# Patient Record
Sex: Female | Born: 1995 | Hispanic: Yes | Smoking: Never smoker
Health system: Southern US, Community
[De-identification: ages and names within clinical notes are randomized; demographics above are authoritative.]

## PROBLEM LIST (undated history)

## (undated) DIAGNOSIS — F99 Mental disorder, not otherwise specified: Secondary | ICD-10-CM

## (undated) HISTORY — DX: Mental disorder, not otherwise specified: F99

---
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
## 2020-11-06 ENCOUNTER — Ambulatory Visit (LOCAL_COMMUNITY_HEALTH_CENTER): Payer: Self-pay | Admitting: Student

## 2020-11-06 ENCOUNTER — Other Ambulatory Visit: Payer: Self-pay

## 2020-11-06 ENCOUNTER — Ambulatory Visit: Payer: Self-pay | Admitting: Family Medicine

## 2020-11-06 ENCOUNTER — Other Ambulatory Visit: Payer: Self-pay | Admitting: Family Medicine

## 2020-11-06 VITALS — BP 122/76 | HR 95 | Ht 60.0 in | Wt 163.8 lb

## 2020-11-06 DIAGNOSIS — Z1331 Encounter for screening for depression: Secondary | ICD-10-CM

## 2020-11-06 DIAGNOSIS — Z113 Encounter for screening for infections with a predominantly sexual mode of transmission: Secondary | ICD-10-CM

## 2020-11-06 DIAGNOSIS — Z23 Encounter for immunization: Secondary | ICD-10-CM

## 2020-11-06 DIAGNOSIS — Z3009 Encounter for other general counseling and advice on contraception: Secondary | ICD-10-CM

## 2020-11-06 LAB — WET PREP FOR TRICH, YEAST, CLUE
Trichomonas Exam: NEGATIVE
Yeast Exam: NEGATIVE

## 2020-11-06 NOTE — Progress Notes (Signed)
TDap given, tolerated well. Secondary apt.

## 2020-11-06 NOTE — Progress Notes (Signed)
Presents for PAP and PE. Desires STD screen and blood work. Results reviewed with provider, no treatment indicated per standing orders. Sharlyne Pacas, RN

## 2020-11-06 NOTE — Progress Notes (Signed)
[facility] [address] [place], [place] [address] Main Number: [phone]  Family Planning Visit- Initial Visit  Subjective:  Barbara Jones is a 24 y.o.  [id number]  being seen today for an initial well woman visit and to discuss family planning options. Patient reports they do not want a pregnancy in the next year.   No chief complaint on file.   Pt does not have a problem list on file.   HPI  Patient reports she is here for physical exam, pap and STI screening.   She uses Nexplanon, has been in 3 years and 7 months. Is currently on medroxyprogesterone acetone (today is day 2) for breakthru bleeding, prescribed by outside provider. Was told to keep Nexplanon in for 5 years by [facility] clinic.    No LMP recorded (lmp unknown). Patient has had an implant. Last sex: yesterday BCM: nexplanon Pt desires EC? n/a  Last pap per pt/review of record: 2018 at [facility] - WNL per pt.  Updated in Epic Care Gaps. Last HIV test per pt/review of record: 2018 Last tetanus vaccine: unsure Covid vaccine: has completed  Last breast exam: never Personal/family hx breast cancer? no  Patient reports 1 partner(s) in last year. Do they desire STI screening (if no, why not)? yes  Does the patient desire a pregnancy in the next year? no  24 y.o., Body mass index is 31.99 kg/m. - Is patient eligible for HA1C diabetes screening based on BMI and age >5?  no  HCV screening;       Has patient been screened once for HCV in the past?  no  No results found for: HCVAB      Does the patient have current drug use, have a partner with drug use, and/or has been incarcerated since last result? no If yes-- Screen for HCV through [facility]   Does the patient meet criteria for HBV testing? no Criteria:  -Household, sexual or needle sharing contact with HBV -History of drug use -HIV positive -Those with known Hep C  PHQ-9 score  22  Working at [employer]. Listens to music for stress release, breathing, puzzles. No exercise.    See flowsheet for other program required questions.   Health Maintenance Due  Topic Date Due  . Hepatitis C Screening  Never done  . COVID-19 Vaccine (1) Never done  . HIV Screening  Never done  . TETANUS/TDAP  Never done  . PAP-Cervical Cytology Screening  11/29/2019  . PAP SMEAR-Modifier  11/29/2019  . INFLUENZA VACCINE  Never done    ROS 10 point review of systems is otherwise negative except as mentioned in HPI and listed below: Blurry vision: d/t needing glasses per pt Wt loss/gain: gain since Nexplanon placement  The following portions of the patient's history were reviewed and updated as appropriate: allergies, current medications, past family history, past medical history, past social history, past surgical history and problem list. Problem list updated.   See flowsheet for other program required questions.  Objective:   Vitals:   11/06/20 1554  BP: 122/76  Pulse: 95  Weight: 163 lb 12.8 oz (74.3 kg)  Height: 5' (1.524 m)    Physical Exam Vitals and nursing note reviewed.  Constitutional:      Appearance: Normal appearance.  HENT:     Head: Normocephalic and atraumatic.     Mouth/Throat:     Mouth: Mucous membranes are moist.     Pharynx: Oropharynx  is clear. No oropharyngeal exudate or posterior oropharyngeal erythema.  Eyes:     Conjunctiva/sclera: Conjunctivae normal.  Neck:     Thyroid: No thyroid mass, thyromegaly or thyroid tenderness.  Cardiovascular:     Rate and Rhythm: Normal rate and regular rhythm.     Pulses: Normal pulses.     Heart sounds: Normal heart sounds.  Pulmonary:     Effort: Pulmonary effort is normal.     Breath sounds: Normal breath sounds.  Chest:     Breasts:        Right: Normal. No swelling, mass, nipple discharge, skin change or tenderness.        Left: Normal. No swelling, mass, nipple discharge,  skin change or tenderness.  Abdominal:     General: Abdomen is flat.     Palpations: There is no mass.     Tenderness: There is no abdominal tenderness. There is no rebound.  Genitourinary:    General: Normal vulva.     Exam position: Lithotomy position.     Pubic Area: No rash or pubic lice.      Labia:            Right: No rash or lesion.            Left: No rash or lesion.      Vagina: Normal. No vaginal erythema, bleeding or lesions. Vaginal discharge: scant, clear, ph>4.5    Cervix: No cervical motion tenderness, discharge, friability, lesion or erythema.     Uterus: Normal.      Adnexa: Right adnexa normal and left adnexa normal.     Rectum: Normal.  Lymphadenopathy:     Head:     Right side of head: No preauricular or posterior auricular adenopathy.     Left side of head: No preauricular or posterior auricular adenopathy.     Cervical: No cervical adenopathy.     Upper Body:     Right upper body: No supraclavicular or axillary adenopathy.     Left upper body: No supraclavicular or axillary adenopathy.     Lower Body: No right inguinal adenopathy. No left inguinal adenopathy.  Skin:    General: Skin is warm and dry.     Findings: No rash. +Faint, well healed linear scars on R forearm Neurological:     Mental Status: She is alert and oriented to person, place, and time.      Assessment and Plan:  Barbara Jones is a 24 y.o. female presenting to the [facility] Department for an initial well woman exam/family planning visit.  Contraception counseling: Reviewed all forms of birth control options in the tiered based approach. available including abstinence; over the counter/barrier methods; hormonal contraceptive medication including pill, patch, ring, injection,contraceptive implant, ECP; hormonal and nonhormonal IUDs; permanent sterilization options including vasectomy and the various tubal sterilization modalities. Risks, benefits, and typical effectiveness  rates were reviewed.  Questions were answered.  Written information was also given to the patient to review.  Patient desires to continue with Nexplanon. She will follow up in  1 year for surveillance.  She was told to call with any further questions, or with any concerns about this method of contraception.  Emphasized use of condoms 100% of the time for STI prevention.  Emergency Contraception: n/a  1. Family planning services -BCM: Nexplanon has been in 3 yrs 7 months. We discussed efficacy likely proven to 4-5 years per UpToDate. She was advised by provider at [facility] to keep until  5 years, is comfortable leaving until then. Declines new Nexplanon or switch to other method today. -Pap: done today -CBE: done today. "Active FYIs" info up to date. Recommended screening mammograms beginning at age 37 -Hepatitis B/C screening: pt qualifies for and accepts hep C screening -Due for tetanus today and agrees to vaccine. RN notified. -A1c screening: n/a - IGP, rfx Aptima HPV ASCU  2. Screening examination for venereal disease -Pt without symptoms. Screenings today as below. Treat wet prep per standing order. -Patient does meet criteria for HepC Screening. Accepts these screenings. -Counseled on warning s/sx and when to seek care. Recommended condom use with all sex and discussed importance of condom use for STI prevention. - WET PREP FOR TRICH, YEAST, CLUE - Chlamydia/Gonorrhea [facility] - HIV/HCV [facility] - Syphilis Serology, [facility]  3. Positive depression screening -Upon flowsheet questioning pt endorses depression w/PHQ-9 score of 22. She would like to speak with a counselor. Will refer to behavioral health today as urgent.  -She endorses thoughts of harming herself with cutting - last cutting episode in November. She denies current thoughts of harm; affect is appropriate and she converses easily today in clinic. I advised pt if further thoughts of harming self are present to  go to ER. 24/7 crisis number card and AM card given to pt.  - Ambulatory referral to Behavioral Health     Return in about 1 year (around 11/06/2021) for yearly wellness exam.  No future appointments.  Ann Held, PA-C

## 2020-11-11 LAB — IGP, RFX APTIMA HPV ASCU: PAP Smear Comment: 0

## 2020-11-16 ENCOUNTER — Encounter: Payer: Self-pay | Admitting: Family Medicine

## 2020-11-18 LAB — HM HIV SCREENING LAB: HM HIV Screening: NEGATIVE

## 2020-11-18 LAB — HM HEPATITIS C SCREENING LAB: HM Hepatitis Screen: NEGATIVE

## 2020-12-09 ENCOUNTER — Ambulatory Visit: Payer: Self-pay | Admitting: Licensed Clinical Social Worker

## 2020-12-24 ENCOUNTER — Ambulatory Visit: Payer: Self-pay | Admitting: Licensed Clinical Social Worker

## 2020-12-24 ENCOUNTER — Encounter: Payer: Self-pay | Admitting: Licensed Clinical Social Worker

## 2020-12-24 DIAGNOSIS — F331 Major depressive disorder, recurrent, moderate: Secondary | ICD-10-CM

## 2020-12-24 DIAGNOSIS — F411 Generalized anxiety disorder: Secondary | ICD-10-CM
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
## 2021-01-13 ENCOUNTER — Ambulatory Visit: Payer: Self-pay | Admitting: Licensed Clinical Social Worker

## 2021-01-13 DIAGNOSIS — F331 Major depressive disorder, recurrent, moderate: Secondary | ICD-10-CM

## 2021-01-13 DIAGNOSIS — F411 Generalized anxiety disorder: Secondary | ICD-10-CM

## 2021-01-13 NOTE — Progress Notes (Signed)
Counselor/Therapist Progress Note  Patient ID: Barbara Jones, MRN: [id number],    Date: 01/13/2021  Time Spent: 50 minutes  Treatment Type: Psychotherapy  Reported Symptoms: depressed mood, anxiety, anxious thoughts, low energy  Mental Status Exam:  Appearance:   Casual and Neat     Behavior:  Appropriate, Sharing and minimal participation  Motor:  Normal  Speech/Language:   Normal Rate  Affect:  Flat  Mood:  depressed and dysthymic  Thought process:  normal  Thought content:    WNL  Sensory/Perceptual disturbances:    WNL  Orientation:  oriented to person, place, time/date and situation  Attention:  Good  Concentration:  Good  Memory:  WNL  Fund of knowledge:   Good  Insight:    Fair  Judgment:   Fair  Impulse Control:  Fair   Risk Assessment: Danger to Self:  No Self-injurious Behavior: No Danger to Others: No Duty to Warn:no Physical Aggression / Violence:No  Access to Firearms a concern: No  Gang Involvement:No   Subjective: Patient was engaged and cooperative throughout the session using time effectively to discuss thoughts, feelings, and treatment plan. Patient voices continued motivation for treatment and understanding of depression and anxiety issues related to family challenges. Patient is likely to benefit from future treatment because she remains motivated to decrease symptoms.    Interventions: Cognitive Behavioral Therapy Checked in with patient and reviewed previous session, including assessment and goal of treatment. Reviewed CBTs. Explored patient's goal of treatment and worked collaboratively to develop CBT treatment plan. Explored patient's challenges with family conflict, assisting patient in identifying personal goals and values related to family. Provided support through active listening, validation of feelings, and highlighted patient's strengths.  Diagnosis:   ICD-10-CM   1. Major depressive disorder, recurrent episode, moderate (HCC)  F33.1    2. Generalized anxiety disorder  F41.1    Plan: follow up on info. Related to banking and other account challenges when you do not have legal [place] status.   Patient's goal of treatment is to not to depend on medications.   Treatment Target: Understand the relationship between thoughts, emotions, and behaviors  - Psychoeducation on CBT model   - Teach the connection between thoughts, emotions, and behaviors  Treatment Target: Increase realistic balanced thinking  - Explore patient's thoughts, beliefs, automatic thoughts, assumptions  - Identify hot thoughts (upsetting ideas, self-talk and mental images) - Process distress and allow for emotional release  - Cognitive reframing  - Questioning and challenging thoughts Treatment Target: Reducing vulnerability to "emotional mind" - Values and goals clarification   - Assertiveness Communication - Self-care - nutrition, sleep, exercise  Treatment Target: Increase coping skills - Mindfulness practices  - Deep breathing  - Grounding techniques as necessary  - STOP technique  Future Appointments  Date Time Provider Department Center  01/27/2021  2:30 PM Kathreen Cosier, LCSW [facility] None    Kathreen Cosier, LCSW

## 2021-01-27 ENCOUNTER — Ambulatory Visit: Payer: Self-pay | Admitting: Licensed Clinical Social Worker

## 2021-02-10 ENCOUNTER — Ambulatory Visit: Payer: Self-pay | Admitting: Licensed Clinical Social Worker

## 2021-02-11 ENCOUNTER — Ambulatory Visit: Payer: Self-pay | Admitting: Licensed Clinical Social Worker

## 2021-02-11 DIAGNOSIS — F411 Generalized anxiety disorder: Secondary | ICD-10-CM

## 2021-02-11 DIAGNOSIS — F331 Major depressive disorder, recurrent, moderate: Secondary | ICD-10-CM

## 2021-02-11 NOTE — Progress Notes (Signed)
Counselor/Therapist Progress Note  Patient ID: Barbara Jones, MRN: [id number],    Date: 02/11/2021  Time Spent: 25 minutes   Treatment Type: Psychotherapy  Reported Symptoms: Decrease in symptoms and mood imporvement continues to take medication as prescribed   Mental Status Exam:  Appearance:   Casual     Behavior:  Appropriate and guarded  Motor:  Normal  Speech/Language:   Slow  Affect:  Appropriate, Congruent and Full Range  Mood:  normal  Thought process:  normal  Thought content:    WNL  Sensory/Perceptual disturbances:    WNL  Orientation:  oriented to person, place, time/date and situation  Attention:  Good  Concentration:  Good  Memory:  WNL  Fund of knowledge:   Good  Insight:    Fair  Judgment:   Good  Impulse Control:  Good   Risk Assessment: Danger to Self:  No Self-injurious Behavior: No Danger to Others: No Duty to Warn:no Physical Aggression / Violence:No  Access to Firearms a concern: No  Gang Involvement:No   Subjective: Patient was engaged and cooperative throughout the session using time effectively to discuss thoughts, feelings and termination of services. Patient voices significant improvement in mood and functioning and reports she is taking medicaiton as prescribed. Patient voices she has met her goal of treatment and would like to seek services as needed.   Interventions: Cognitive Behavioral Therapy Checked in with patient regarding symptoms and psychosocial stressors. Reviewed previous session with patient regarding living within her values. Discussed patient's progress in treatment. Engaged patient in relapse prevention discussion, reviewing behavior activation strategies to prevent depressed mood. Encouraged patient to return as needed. Provided support through active listening, validation of feelings, and highlighted patient's strengths.   Diagnosis:   ICD-10-CM   1. Major depressive disorder, recurrent episode, moderate (HCC)  F33.1    2. Generalized anxiety disorder  F41.1     Plan: Patient will schedule appointment as needed.    Milton Ferguson, LCSW

## 2021-02-18 ENCOUNTER — Ambulatory Visit: Payer: Self-pay | Admitting: Licensed Clinical Social Worker

## 2021-03-18 ENCOUNTER — Ambulatory Visit: Payer: Self-pay | Admitting: Licensed Clinical Social Worker

## 2021-04-01 ENCOUNTER — Ambulatory Visit: Payer: Self-pay | Admitting: Licensed Clinical Social Worker

## 2021-04-01 DIAGNOSIS — F331 Major depressive disorder, recurrent, moderate: Secondary | ICD-10-CM

## 2021-04-01 DIAGNOSIS — F411 Generalized anxiety disorder: Secondary | ICD-10-CM

## 2021-04-01 NOTE — Progress Notes (Signed)
Counselor/Therapist Progress Note  Patient ID: Barbara Jones, MRN: [id number],    Date: 04/01/2021  Time Spent:  50 minutes  Treatment Type: Psychotherapy  Reported Symptoms: depressed mood, anxiety, night mares, intrusive thoughts  Mental Status Exam:  Appearance:   Casual and Neat     Behavior:  Appropriate and Sharing  Motor:  Normal  Speech/Language:   Normal Rate  Affect:  Appropriate and Congruent  Mood:  normal  Thought process:  goal directed  Thought content:    WNL  Sensory/Perceptual disturbances:    WNL  Orientation:  oriented to person, place, time/date, situation and month of year  Attention:  Good  Concentration:  Good  Memory:  WNL  Fund of knowledge:   Good  Insight:    Good  Judgment:   Good  Impulse Control:  Good   Risk Assessment: Danger to Self:  No Self-injurious Behavior: No Danger to Others: No Duty to Warn:no Physical Aggression / Violence:No  Access to Firearms a concern: No  Gang Involvement:No   Subjective: Patient was engaged and cooperative throughout the session using time effectively to discuss thoughts and feelings. Patient voices continued motivation for treatment and understanding of trauma symptoms. Patient is likely to benefit from future treatment because she desires to decrease symptoms related to childhood trauma.   Interventions: Cognitive Behavioral Therapy Checked in with patient regarding her week. Engaged patient in processing current psychosocial stressors, increase in trauma symptoms due to triggers. Discussed childhood trauma and provided psychoeducation on trauma. Taught patient grounding techniques using her 5 senses. Provided support through active listening, validation of feelings, and highlighted patient's strengths.   Diagnosis:   ICD-10-CM   1. Major depressive disorder, recurrent episode, moderate (HCC)  F33.1   2. Generalized anxiety disorder  F41.1    Plan: Patient's goal of treatment isto not to depend  on medications.  Treatment Target: Understand the relationship between thoughts, emotions, and behaviors   Psychoeducation on CBT model    Teach the connection between thoughts, emotions, and behaviors  Treatment Target: Increase realistic balanced thinking   Explore patient's thoughts, beliefs, automatic thoughts, assumptions   Identify hot thoughts(upsetting ideas, self-talk and mental images)  Process distress/trauma and allow for emotional release   Cognitive reframing   Questioning and challenging thoughts Treatment Target: Reducing vulnerability to "emotional mind"  Values and goals clarification    Assertiveness Communication  Self-care - nutrition, sleep, exercise  Treatment Target: Increase coping skills  Mindfulness practices   Deep breathing   Grounding techniques as necessary   STOP technique  Future Appointments  Date Time Provider Department Center  04/29/2021  3:00 PM Kathreen Cosier, LCSW [facility] None   Kathreen Cosier, LCSW

## 2021-04-29 ENCOUNTER — Ambulatory Visit: Payer: Self-pay | Admitting: Licensed Clinical Social Worker

## 2021-04-29 DIAGNOSIS — F411 Generalized anxiety disorder: Secondary | ICD-10-CM

## 2021-04-29 DIAGNOSIS — F331 Major depressive disorder, recurrent, moderate: Secondary | ICD-10-CM

## 2021-04-29 NOTE — Progress Notes (Signed)
Counselor/Therapist Progress Note  Patient ID: Barbara Jones, MRN: [id number],    Date: 04/29/2021  Time Spent: 41 minutes    Treatment Type: Psychotherapy  Reported Symptoms: mood improvement  Mental Status Exam:  Appearance:   Casual and Well Groomed     Behavior:  Appropriate and Sharing  Motor:  Normal  Speech/Language:   Normal Rate  Affect:  Appropriate and Congruent  Mood:  normal  Thought process:  normal  Thought content:    WNL  Sensory/Perceptual disturbances:    WNL  Orientation:  oriented to person, place, time/date, situation, day of week and month of year  Attention:  Good  Concentration:  Good  Memory:  WNL  Fund of knowledge:   Good  Insight:    Good  Judgment:   Good  Impulse Control:  Good   Risk Assessment: Danger to Self:  No Self-injurious Behavior: No Danger to Others: No Duty to Warn:no Physical Aggression / Violence:No  Access to Firearms a concern: No  Gang Involvement:No   Subjective: Patient was engaged and cooperative throughout the session using time effectively to discuss thoughts, feelings, and mindfulness. Patient voices a desire to schedule appointments as needed. She continues medication management and reports benefit. She also reports benefit from grounding 5 senses exercises discussed at last session.    Interventions: Cognitive Behavioral Therapy and Mindfulness Meditation Checked in with patient regarding her week. Reviewed previous session regarding use of grounding techniques. Engaged patient in processing recent conflict with romantic partner which led to sadness. Validated patient's feelings. Highlighted and discussed patient's use of journaling and assertiveness communication to cope with the distress. Engaged patient in mindfulness exercise, processed exercise and encouraged her to utilize. Patient was engaged and cooperative throughout the session using time effectively to discuss   Diagnosis:   ICD-10-CM   1. Major  depressive disorder, recurrent episode, moderate (HCC)  F33.1   2. Generalized anxiety disorder  F41.1    Plan:  Patient's goal of treatment isto not to depend on medications. Treatment Target: Understand the relationship between thoughts, emotions, and behaviors   Psychoeducation on CBT model   Teach the connection between thoughts, emotions, and behaviors  Treatment Target: Increase realistic balanced thinking   Explore patient's thoughts, beliefs, automatic thoughts, assumptions   Identify hot thoughts(upsetting ideas, self-talk and mental images)  Process distress/trauma and allow for emotional release   Cognitive reframing   Questioning and challenging thoughts Treatment Target: Reducing vulnerability to "emotional mind"  Valuesand goalsclarification  Assertiveness Communication  Self-care -nutrition, sleep, exercise  Treatment Target: Increase coping skills  Mindfulness practices   Deep breathing   Grounding techniques as necessary   STOP technique  No future appointments. Patient will reach out via email when she needs another appointment.  Kathreen Cosier, LCSW

## 2021-09-06 ENCOUNTER — Ambulatory Visit: Payer: Self-pay | Admitting: Licensed Clinical Social Worker

## 2021-09-06 DIAGNOSIS — F331 Major depressive disorder, recurrent, moderate: Secondary | ICD-10-CM

## 2021-09-06 DIAGNOSIS — F411 Generalized anxiety disorder: Secondary | ICD-10-CM

## 2021-09-06 NOTE — Progress Notes (Signed)
Counselor/Therapist Progress Note  Patient ID: Barbara Jones, MRN: [id number],    Date: 09/06/2021  Time Spent: 45 minutes    Treatment Type: Psychotherapy  Reported Symptoms:  Anxiety, anxious thoughts, low mood  Mental Status Exam:  Appearance:   Casual and Well Groomed     Behavior:  Appropriate and Sharing  Motor:  Normal  Speech/Language:   Normal Rate  Affect:  Appropriate and Congruent  Mood:  normal  Thought process:  normal  Thought content:    WNL  Sensory/Perceptual disturbances:    WNL  Orientation:  oriented to person, place, time/date, situation, and day of week  Attention:  Good  Concentration:  Good  Memory:  WNL  Fund of knowledge:   Good  Insight:    Good  Judgment:   Good  Impulse Control:  Good   Risk Assessment: Danger to Self:  No Self-injurious Behavior: No Danger to Others: No Duty to Warn:no Physical Aggression / Violence:No  Access to Firearms a concern: No  Gang Involvement:No   Subjective: Patient was receptive to feedback and intervention from LCSW and actively and effectively participated throughout the session. Patient is likely to benefit from future treatment because she remains motivated to decrease anxiety and depression and reports benefit of sessions in addressing these symptoms.    Interventions: Cognitive Behavioral Therapy Checked in with patient regarding current psychosocial stressors, financial stressors and family challenges. Explored patient's perception of challenges to increase understanding and to identify unhelpful thoughts, using challenging thoughts and reframing techniques. Discussed patient's thoughts and beliefs related to family challenges and discussed effective communication skills and boundary setting. LCSW reviewed intentional breathing encouraging patient to engage in breathing exercises often throughout her day. Provided support through active listening, validation of feelings, and highlighted patient's  strengths.    Diagnosis:   ICD-10-CM   1. Major depressive disorder, recurrent episode, moderate (HCC)  F33.1     2. Generalized anxiety disorder  F41.1      Plan: Patient's goal of treatment is to not to depend on medications.  Patient desires to have appointments as needed.  Treatment Target: Understand the relationship between thoughts, emotions, and behaviors  Psychoeducation on CBT model   Teach the connection between thoughts, emotions, and behaviors  Treatment Target: Increase realistic balanced thinking  Explore patient's thoughts, beliefs, automatic thoughts, assumptions  Identify hot thoughts (upsetting ideas, self-talk and mental images) Process distress/trauma and allow for emotional release  Cognitive reframing  Questioning and challenging thoughts Treatment Target: Reducing vulnerability to "emotional mind" Values and goals clarification   Assertiveness Communication Self-care - nutrition, sleep, exercise  Treatment Target: Increase coping skills Mindfulness practices  Deep breathing/intentional breathing  Grounding techniques as necessary  STOP technique  Future Appointments  Date Time Provider Department Center  09/27/2021  2:00 PM Kathreen Cosier, LCSW [facility] None    Kathreen Cosier, LCSW

## 2021-09-27 ENCOUNTER — Ambulatory Visit: Payer: Self-pay | Admitting: Licensed Clinical Social Worker

## 2021-10-20 ENCOUNTER — Ambulatory Visit
Admission: EM | Admit: 2021-10-20 | Discharge: 2021-10-20 | Disposition: A | Discharge status: Home / Self Care | Payer: Self-pay

## 2021-10-20 DIAGNOSIS — T7840XA Allergy, unspecified, initial encounter: Secondary | ICD-10-CM

## 2021-10-20 DIAGNOSIS — J069 Acute upper respiratory infection, unspecified: Secondary | ICD-10-CM

## 2021-10-20 MED ORDER — PROMETHAZINE-DM 6.25-15 MG/5ML PO SYRP: 5.0000 mL | ORAL_SOLUTION | Freq: Four times a day (QID) | ORAL | 0 refills | Status: AC | PRN

## 2021-10-20 MED ORDER — BENZONATATE 100 MG PO CAPS: 200.0000 mg | ORAL_CAPSULE | Freq: Three times a day (TID) | ORAL | 0 refills | Status: AC

## 2021-10-20 MED ORDER — IPRATROPIUM BROMIDE 0.06 % NA SOLN: 2.0000 | Freq: Four times a day (QID) | NASAL | 12 refills | Status: AC

## 2021-10-20 MED ORDER — PREDNISONE 10 MG (21) PO TBPK: ORAL_TABLET | ORAL | 0 refills | Status: AC

## 2021-10-20 NOTE — ED Provider Notes (Signed)
[facility]    CSN: [id number] Arrival date & time: 10/20/21  1028      History   Chief Complaint Chief Complaint  Patient presents with   Urticaria    HPI Barbara Jones is a 25 y.o. female.   HPI  89 old female here for evaluation of hives.  Patient reports that she has had an upper respiratory infection for the past 3 days.  She saw her PCP yesterday for evaluation of her cough, runny nose, nasal congestion and was told that she had a virus and to take over-the-counter NyQuil and DayQuil.  She reports that she took the DayQuil, some ibuprofen, and ate some food that she has eaten before several hours prior to the onset of hives.  She states that the hives have been around 9 to 9:30 PM.  She also started running a fever that time the T-max of 100.8.  She took Benadryl and oatmeal bath which did help alleviate the itching.  The hives are still present.  She denies experiencing any swelling of her lips or tongue, difficulty breathing, or difficulty swallowing.  Patient has taken DayQuil and NyQuil in the past, as well as ibuprofen, without issue.  Past Medical History:  Diagnosis Date   Mental disorder    depression    There are no problems to display for this patient.   History reviewed. No pertinent surgical history.  OB History     Gravida  0   Para  0   Term  0   Preterm  0   AB  0   Living  0      SAB  0   IAB  0   Ectopic  0   Multiple  0   Live Births  0            Home Medications    Prior to Admission medications   Medication Sig Start Date End Date Taking? Authorizing Provider  benzonatate (TESSALON) 100 MG capsule Take 2 capsules (200 mg total) by mouth every 8 (eight) hours. 10/20/21  Yes Becky Augusta, NP  Etonogestrel [facility]) Inject into the skin. Placed May, 2018 per pt   Yes [provider]  FLUoxetine (PROZAC) 40 MG capsule Take 40 mg by mouth daily. 09/03/21  Yes [provider]   ipratropium (ATROVENT) 0.06 % nasal spray Place 2 sprays into both nostrils 4 (four) times daily. 10/20/21  Yes Becky Augusta, NP  predniSONE (STERAPRED UNI-PAK 21 TAB) 10 MG (21) TBPK tablet Take 6 tablets on day 1, 5 tablets day 2, 4 tablets day 3, 3 tablets day 4, 2 tablets day 5, 1 tablet day 6 10/20/21  Yes Becky Augusta, NP  promethazine-dextromethorphan (PROMETHAZINE-DM) 6.25-15 MG/5ML syrup Take 5 mLs by mouth 4 (four) times daily as needed. 10/20/21  Yes Becky Augusta, NP  MEDROXYPROGESTERONE ACETATE PO Take by mouth.    [provider]  pantoprazole (PROTONIX) 40 MG tablet Take 40 mg by mouth daily.    [provider]    Family History Family History  Problem Relation Age of Onset   Diabetes Father    Migraines Mother    Breast cancer Neg Hx     Social History Social History   Tobacco Use   Smoking status: Never   Smokeless tobacco: Never  Vaping Use   Vaping Use: Some days   Substances: CBD   Devices: CBD pen  Substance Use Topics   Alcohol use: Yes  Comment: "socially"    Drug use: Never     Allergies   Methylmethacrylate monomer [methylmethacrylate]   Review of Systems Review of Systems  Constitutional:  Positive for fever. Negative for activity change and appetite change.  HENT:  Positive for congestion and rhinorrhea. Negative for ear pain and sore throat.   Respiratory:  Positive for cough and shortness of breath. Negative for wheezing.   Skin:  Positive for color change and rash.  Hematological: Negative.   Psychiatric/Behavioral: Negative.      Physical Exam Triage Vital Signs ED Triage Vitals  Enc Vitals Group     BP 10/20/21 1129 129/86     Pulse Rate 10/20/21 1129 (!) 110     Resp 10/20/21 1129 18     Temp 10/20/21 1129 98.9 F (37.2 C)     Temp Source 10/20/21 1129 Oral     SpO2 10/20/21 1129 99 %     Weight 10/20/21 1127 167 lb (75.8 kg)     Height 10/20/21 1127 5' (1.524 m)     Head Circumference --      Peak Flow  --      Pain Score 10/20/21 1126 0     Pain Loc --      Pain Edu? --      Excl. in GC? --    No data found.  Updated Vital Signs BP 129/86 (BP Location: Right Arm)   Pulse (!) 110   Temp 98.9 F (37.2 C) (Oral)   Resp 18   Ht 5' (1.524 m)   Wt 167 lb (75.8 kg)   LMP 10/13/2021   SpO2 99%   BMI 32.61 kg/m   Visual Acuity Right Eye Distance:   Left Eye Distance:   Bilateral Distance:    Right Eye Near:   Left Eye Near:    Bilateral Near:     Physical Exam Vitals and nursing note reviewed.  Constitutional:      General: She is not in acute distress.    Appearance: Normal appearance. She is normal weight. She is not ill-appearing.  HENT:     Head: Normocephalic and atraumatic.     Right Ear: Tympanic membrane, ear canal and external ear normal. There is no impacted cerumen.     Left Ear: Tympanic membrane, ear canal and external ear normal. There is no impacted cerumen.     Nose: Congestion and rhinorrhea present.     Mouth/Throat:     Mouth: Mucous membranes are moist.     Pharynx: Oropharynx is clear. Posterior oropharyngeal erythema present.  Cardiovascular:     Rate and Rhythm: Normal rate and regular rhythm.     Pulses: Normal pulses.     Heart sounds: Normal heart sounds. No murmur heard.   No gallop.  Pulmonary:     Effort: Pulmonary effort is normal.     Breath sounds: Normal breath sounds. No stridor. No wheezing, rhonchi or rales.  Musculoskeletal:     Cervical back: Normal range of motion and neck supple.  Lymphadenopathy:     Cervical: No cervical adenopathy.  Skin:    General: Skin is warm and dry.     Capillary Refill: Capillary refill takes less than 2 seconds.     Findings: Rash present.  Neurological:     General: No focal deficit present.     Mental Status: She is alert and oriented to person, place, and time.  Psychiatric:        Mood and Affect:  Mood normal.        Behavior: Behavior normal.        Thought Content: Thought content  normal.        Judgment: Judgment normal.     UC Treatments / Results  Labs (all labs ordered are listed, but only abnormal results are displayed) Labs Reviewed - No data to display  EKG   Radiology No results found.  Procedures Procedures (including critical care time)  Medications Ordered in UC Medications - No data to display  Initial Impression / Assessment and Plan / UC Course  I have reviewed the triage vital signs and the nursing notes.  Pertinent labs & imaging results that were available during my care of the patient were reviewed by me and considered in my medical decision making (see chart for details).  Patient very pleasant, nontoxic-appearing 20 old female here for evaluation of upper respiratory symptoms and urticaria that began last night.  Her upper respiratory symptoms have been present for last 3 days.  She was evaluated by her PCP yesterday, thought it was a virus, and told to take over-the-counter cold medicine.  She has taken DayQuil and NyQuil, which she has taken before, and ibuprofen.  She is taken all his medications in the past without incident.  She states she also ate some food that she is eaten before without issue.  The urticaria started several hours removed from any medication administration or food consumption.  There were not associated with swelling of the lips or tongue, tightness in throat, or shortness of breath.  Patient states she does have some sort of breath when she coughs but that predates the hives.  Patient still has hives present she has a hive on her posterior right shoulder, anterior left clavicle, and then scattered maculopapular lesions on both forearms and the flexor surfaces.  Patient has no stridor when auscultating over the trachea.  Oropharyngeal exam reveals Mallampati score of 2.  No edema, erythema, or injection noted.  No cervical lymphadenopathy appreciated exam.  Cardiopulmonary exam reveals clear lung sounds all fields.  It is  unclear if the urticaria are secondary to a viral process or to a separate allergic reaction.  We will treat patient for allergic reaction with prednisone, Allegra, Zyrtec, or Claritin during the day and Benadryl at night.  I have also advised her to take Pepcid 20 mg twice daily.  I will have her cease all the over-the-counter cold medication and will treat her cough with a combination of Tessalon Perles and Promethazine DM cough syrup.  We will give Atrovent nasal spray to help with nasal congestion.  I have advised the patient that if she has return of hives that is associated with tightness in her throat, difficulty swallowing, difficulty breathing she is to go to the ER for evaluation.   Final Clinical Impressions(s) / UC Diagnoses   Final diagnoses:  Allergic reaction, initial encounter  Viral URI with cough     Discharge Instructions      Take over-the-counter Allegra 180 mg daily or Zyrtec or Claritin 10 mg daily to help with your itching.  You can take over-the-counter Benadryl, 50 mg at bedtime, as needed for itching and sleep.  Take the prednisone pack according to the package instructions.  You will taken on tapering dose over a period of 6 days.  Take it with food and always take it first in the morning with breakfast.  Take over-the-counter Pepcid 20 mg twice daily to help with itching  as well.  Stop the Dayquil, Nyquil and Ibuprofen.  You can use Tylenol as needed for fever or pain.  Use the Atrovent nasal spray, 2 squirts in each nostril every 6 hours, as needed for runny nose and postnasal drip.  Use the Tessalon Perles every 8 hours during the day.  Take them with a small sip of water.  They may give you some numbness to the base of your tongue or a metallic taste in your mouth, this is normal.  Use the Promethazine DM cough syrup at bedtime for cough and congestion.  It will make you drowsy so do not take it during the day.  Return for reevaluation or see your  primary care provider for any new or worsening symptoms.   If you develop any swelling of your lips or tongue, tightness in your throat, or difficulty breathing you need to go to the ER for evaluation.      ED Prescriptions     Medication Sig Dispense Auth. Provider   predniSONE (STERAPRED UNI-PAK 21 TAB) 10 MG (21) TBPK tablet Take 6 tablets on day 1, 5 tablets day 2, 4 tablets day 3, 3 tablets day 4, 2 tablets day 5, 1 tablet day 6 21 tablet Becky Augusta, NP   ipratropium (ATROVENT) 0.06 % nasal spray Place 2 sprays into both nostrils 4 (four) times daily. 15 mL Becky Augusta, NP   benzonatate (TESSALON) 100 MG capsule Take 2 capsules (200 mg total) by mouth every 8 (eight) hours. 21 capsule Becky Augusta, NP   promethazine-dextromethorphan (PROMETHAZINE-DM) 6.25-15 MG/5ML syrup Take 5 mLs by mouth 4 (four) times daily as needed. 118 mL Becky Augusta, NP      PDMP not reviewed this encounter.   Becky Augusta, NP 10/20/21 1234

## 2021-10-20 NOTE — Discharge Instructions (Signed)
Take over-the-counter Allegra 180 mg daily or Zyrtec or Claritin 10 mg daily to help with your itching.  You can take over-the-counter Benadryl, 50 mg at bedtime, as needed for itching and sleep.  Take the prednisone pack according to the package instructions.  You will taken on tapering dose over a period of 6 days.  Take it with food and always take it first in the morning with breakfast.  Take over-the-counter Pepcid 20 mg twice daily to help with itching as well.  Stop the Dayquil, Nyquil and Ibuprofen.  You can use Tylenol as needed for fever or pain.  Use the Atrovent nasal spray, 2 squirts in each nostril every 6 hours, as needed for runny nose and postnasal drip.  Use the Tessalon Perles every 8 hours during the day.  Take them with a small sip of water.  They may give you some numbness to the base of your tongue or a metallic taste in your mouth, this is normal.  Use the Promethazine DM cough syrup at bedtime for cough and congestion.  It will make you drowsy so do not take it during the day.  Return for reevaluation or see your primary care provider for any new or worsening symptoms.   If you develop any swelling of your lips or tongue, tightness in your throat, or difficulty breathing you need to go to the ER for evaluation.

## 2021-10-20 NOTE — ED Triage Notes (Signed)
Pt here with C/O cough since Monday night, went to PCP yesterday and was told that it was a Virus to take Nyquil and Dayquil. Pt states last night around 9pm she broke out in hives and started running fever 100.8. Did take benadryl and oat meal bath last night with little relief.

## 2021-12-07 ENCOUNTER — Ambulatory Visit: Payer: 59 | Admitting: Licensed Clinical Social Worker

## 2021-12-07 DIAGNOSIS — F411 Generalized anxiety disorder: Secondary | ICD-10-CM

## 2021-12-07 DIAGNOSIS — F331 Major depressive disorder, recurrent, moderate: Secondary | ICD-10-CM

## 2021-12-07 NOTE — Progress Notes (Signed)
Counselor/Therapist Progress Note  Patient ID: Barbara Jones, MRN: [id number],    Date: 12/07/2021  Time Spent: 34 minutes   Treatment Type: Psychotherapy  Reported Symptoms:  overall stable mood   Mental Status Exam:  Appearance:   Casual     Behavior:  Appropriate and Sharing  Motor:  Normal  Speech/Language:   Clear and Coherent and Normal Rate  Affect:  Appropriate and Congruent  Mood:  normal  Thought process:  normal  Thought content:    WNL  Sensory/Perceptual disturbances:    WNL  Orientation:  oriented to person, place, time/date, and situation  Attention:  Fair  Concentration:  Fair  Memory:  WNL  Fund of knowledge:   Good  Insight:    Good  Judgment:   Good  Impulse Control:  Good   Risk Assessment: Danger to Self:  No Self-injurious Behavior: No Danger to Others: No Duty to Warn:no Physical Aggression / Violence:No  Access to Firearms a concern: No  Gang Involvement:No   Subjective: Patient was receptive to feedback and intervention from LCSW. Patient is likely to benefit from future treatment because they remain motivated to manage symptoms and improve functioning. Patient reports continued benefit from medication management - Prozac.    Interventions: Mindfulness Meditation and Client Centered, psychoeducation Established psychological safety.Checked in with patient regarding mood and current psychosocial stressors. LCSW assisted patient in processing their thoughts and emotions about what they experienced with recent challenges with family of origin and  with experiences of disassociation- depersonalization.  LCSW taught patient about disassociation- depersonalization/childhood trauma and reviewed mindfulness and or grounding techniques to come back to the moment by using 5 senses. Checked in on patient's use of medication management. Provided support through active listening, validation of feelings, and highlighted patient's strengths.   Diagnosis:    ICD-10-CM   1. Major depressive disorder, recurrent episode, moderate (HCC)  F33.1     2. Generalized anxiety disorder  F41.1       Plan: Patient's goal of treatment is to not to depend on medications.  Patient desires to have appointments as needed.  Treatment Target: Understand the relationship between thoughts, emotions, and behaviors  Psychoeducation on CBT model   Teach the connection between thoughts, emotions, and behaviors  Treatment Target: Increase realistic balanced thinking  Explore patients thoughts, beliefs, automatic thoughts, assumptions  Identify hot thoughts (upsetting ideas, self-talk and mental images) Process distress/trauma and allow for emotional release  Cognitive reframing  Questioning and challenging thoughts Treatment Target: Reducing vulnerability to emotional mind Values and goals clarification   Assertiveness Communication Self-care - nutrition, sleep, exercise  Treatment Target: Increase coping skills Mindfulness practices  Deep breathing/intentional breathing  Grounding techniques as necessary  STOP technique  No future appointments.   Milton Ferguson, LCSW

## 2022-01-04 ENCOUNTER — Ambulatory Visit: Payer: 59 | Admitting: Licensed Clinical Social Worker

## 2022-01-04 DIAGNOSIS — F331 Major depressive disorder, recurrent, moderate: Secondary | ICD-10-CM

## 2022-01-04 DIAGNOSIS — F411 Generalized anxiety disorder: Secondary | ICD-10-CM

## 2022-01-04 NOTE — Progress Notes (Signed)
Counselor/Therapist Progress Note  Patient ID: Jenisha Faison, MRN: [id number],    Date: 01/04/2022  Time Spent: 35 minutes - patient arrived late   Treatment Type: Psychotherapy  Reported Symptoms:  Overall mood stability; mild anxiety, anxious thoughts  Mental Status Exam:  Appearance:   Casual and Neat     Behavior:  Appropriate and Sharing  Motor:  Normal  Speech/Language:   Clear and Coherent and Normal Rate  Affect:  Appropriate, Congruent, and Full Range  Mood:  euthymic  Thought process:  normal  Thought content:    WNL  Sensory/Perceptual disturbances:    WNL  Orientation:  oriented to person, place, time/date, and situation  Attention:  Good  Concentration:  Good  Memory:  WNL  Fund of knowledge:   Good  Insight:    Good  Judgment:   Good  Impulse Control:  Good   Risk Assessment: Danger to Self:  No Self-injurious Behavior: No Danger to Others: No Duty to Warn:no Physical Aggression / Violence:No  Access to Firearms a concern: No  Gang Involvement:No   Subjective: Patient was engaged and cooperative throughout the session using time effectively to discuss thoughts and feelings.  Patient was receptive to feedback and intervention from LCSW. Patient is likely to benefit from future treatment because they remain motivated to manage depression and anxiety and report benefit of occasional sessions.        Interventions: Cognitive Behavioral Therapy and Psycho-education/Bibliotherapy Established psychological safety. Checked in with patient. Engaged patient in setting session agenda. Provided psychoeducation on medication management and management of depression and anxiety during pregnancy and post partum, including having good support, exercise, a healthy diet, good sleep hygiene and encouraged patient to continue medication as prescriber recommended. LCSW provided sleep hygiene tips, including establishing a bedtime routine, using bed for sex and sleep, use of  mindfulness before bed, putting away electronics at least 30 minutes before bed. Provided support through active listening, validation of feelings, and highlighted patient's strengths.    Diagnosis:   ICD-10-CM   1. Major depressive disorder, recurrent episode, moderate (HCC)  F33.1     2. Generalized anxiety disorder  F41.1       Plan: Patient's goal of treatment is to not to depend on medications.  Patient desires to have appointments as needed.  Treatment Target: Understand the relationship between thoughts, emotions, and behaviors  Psychoeducation on CBT model   Teach the connection between thoughts, emotions, and behaviors  Treatment Target: Increase realistic balanced thinking  Explore patients thoughts, beliefs, automatic thoughts, assumptions  Identify hot thoughts (upsetting ideas, self-talk and mental images) Process distress/trauma and allow for emotional release  Cognitive reframing  Questioning and challenging thoughts Treatment Target: Reducing vulnerability to emotional mind Values and goals clarification   Assertiveness Communication Self-care - nutrition, sleep, exercise  Treatment Target: Increase coping skills Mindfulness practices  Deep breathing/intentional breathing  Grounding techniques as necessary  STOP technique  No future appointments. Patient to schedule appointments as needed.   Kathreen Cosier, LCSW

## 2022-04-22 ENCOUNTER — Ambulatory Visit (LOCAL_COMMUNITY_HEALTH_CENTER): Payer: Managed Care, Other (non HMO) | Admitting: Nurse Practitioner

## 2022-04-22 ENCOUNTER — Ambulatory Visit: Payer: Self-pay

## 2022-04-22 ENCOUNTER — Encounter: Payer: Self-pay | Admitting: Nurse Practitioner

## 2022-04-22 VITALS — BP 124/77 | HR 81 | Temp 97.9°F

## 2022-04-22 DIAGNOSIS — Z3049 Encounter for surveillance of other contraceptives: Secondary | ICD-10-CM

## 2022-04-22 DIAGNOSIS — Z3009 Encounter for other general counseling and advice on contraception: Secondary | ICD-10-CM

## 2022-04-22 DIAGNOSIS — Z01419 Encounter for gynecological examination (general) (routine) without abnormal findings: Secondary | ICD-10-CM | POA: Diagnosis not present

## 2022-04-22 DIAGNOSIS — Z113 Encounter for screening for infections with a predominantly sexual mode of transmission: Secondary | ICD-10-CM

## 2022-04-22 LAB — WET PREP FOR TRICH, YEAST, CLUE
Trichomonas Exam: NEGATIVE
Yeast Exam: NEGATIVE

## 2022-04-22 LAB — HM HIV SCREENING LAB: HM HIV Screening: NEGATIVE

## 2022-04-23 MED ORDER — NORETHINDRONE 0.35 MG PO TABS: 1.0000 | ORAL_TABLET | Freq: Every day | ORAL | 12 refills | Status: AC

## 2022-04-23 NOTE — Progress Notes (Signed)
[facility] DEPARTMENT [facility] [address] Main Number: [phone]    Family Planning Visit- Initial Visit  Subjective:  Barbara Jones is a 26 y.o.  G0P0000   being seen today for an initial annual visit and to discuss reproductive life planning.  The patient is currently using Hormonal Implant for pregnancy prevention. Patient reports does want a pregnancy in the next year.     report they are looking for a method that provides Method they can control starting/stopping  Patient has the following medical conditions does not have a problem list on file.  Chief Complaint  Patient presents with   Contraception    Patient reports to clinic today for a physical and desires to have her Nexplanon removed.  Patient states she had her Nexplanon inserted on 03/28/2017.  Patient desires STD screening today.     There is no height or weight on file to calculate BMI. - Patient is eligible for diabetes screening based on BMI and age >28?  not applicable HA1C ordered? not applicable  Patient reports 1  partner/s in last year. Desires STI screening?  Yes  Has patient been screened once for HCV in the past?  No  No results found for: HCVAB  Does the patient have current drug use (including MJ), have a partner with drug use, and/or has been incarcerated since last result? No  If yes-- Screen for HCV through [facility]   Does the patient meet criteria for HBV testing? No  Criteria:  -Household, sexual or needle sharing contact with HBV -History of drug use -HIV positive -Those with known Hep C   Health Maintenance Due  Topic Date Due   COVID-19 Vaccine (1) Never done   PAP-Cervical Cytology Screening  11/29/2019    Review of Systems  Constitutional:  Negative for chills, fever, malaise/fatigue and weight loss.       Heat and cold intolerance Weight gain    HENT:  Negative for congestion, hearing loss and sore throat.   Eyes:   Positive for blurred vision. Negative for double vision and photophobia.  Respiratory:  Negative for shortness of breath.   Cardiovascular:  Negative for chest pain.  Gastrointestinal:  Positive for nausea. Negative for abdominal pain, blood in stool, constipation, diarrhea, heartburn and vomiting.  Genitourinary:  Negative for dysuria and frequency.  Musculoskeletal:  Negative for back pain, joint pain and neck pain.  Skin:  Negative for itching and rash.  Neurological:  Positive for dizziness. Negative for weakness and headaches.  Endo/Heme/Allergies:  Bruises/bleeds easily.  Psychiatric/Behavioral:  Negative for depression, substance abuse and suicidal ideas.    The following portions of the patient's history were reviewed and updated as appropriate: allergies, current medications, past family history, past medical history, past social history, past surgical history and problem list. Problem list updated.   See flowsheet for other program required questions.  Objective:   Vitals:   04/22/22 1421  BP: 124/77  Pulse: 81  Temp: 97.9 F (36.6 C)    Physical Exam Constitutional:      Appearance: Normal appearance.  HENT:     Head: Normocephalic.     Right Ear: External ear normal.     Left Ear: External ear normal.     Nose: Nose normal.     Mouth/Throat:     Lips: Pink.     Mouth: Mucous membranes are moist.     Comments: No visible signs of dental caries  Eyes:     Pupils: Pupils are equal, round, and reactive to light.  Cardiovascular:     Rate and Rhythm: Normal rate and regular rhythm.  Pulmonary:     Effort: Pulmonary effort is normal.     Breath sounds: Normal breath sounds.  Chest:     Comments: Breasts:        Right: Normal. No swelling, mass, nipple discharge, skin change or tenderness.        Left: Normal. No swelling, mass, nipple discharge, skin change or tenderness.   Abdominal:     General: Abdomen is flat. Bowel sounds are normal.     Palpations:  Abdomen is soft.  Genitourinary:    Comments: External genitalia/pubic area without nits, lice, edema, erythema, lesions and inguinal adenopathy. Vagina with normal mucosa and discharge. Cervix without visible lesions. Uterus firm, mobile, nt, no masses, no CMT, no adnexal tenderness or fullness. pH 4.5. Musculoskeletal:     Cervical back: Full passive range of motion without pain, normal range of motion and neck supple.  Skin:    General: Skin is warm and dry.  Neurological:     Mental Status: She is alert and oriented to person, place, and time.  Psychiatric:        Attention and Perception: Attention normal.        Mood and Affect: Mood normal.        Speech: Speech normal.        Behavior: Behavior normal. Behavior is cooperative.      Assessment and Plan:  Barbara Jones is a 26 y.o. female presenting to the [facility] Department for an initial annual wellness/contraceptive visit  Contraception counseling: Reviewed options based on patient desire and reproductive life plan. Patient is interested in Oral Contraceptive. This was provided to the patient today.   Risks, benefits, and typical effectiveness rates were reviewed.  Questions were answered.  Written information was also given to the patient to review.    The patient will follow up in  1 years for surveillance.  The patient was told to call with any further questions, or with any concerns about this method of contraception.  Emphasized use of condoms 100% of the time for STI prevention.  Need for ECP was assessed. Patient offered ECP, and patient declined.    1. Family planning services -26 year old female in clinic today for a physical and to have her Nexplanon removed. -ROS reviewed.  Patient complains of heat and cold intolerance, dizziness, blurry vision, bruising, weight gain, headache, and nausea.  Patient states she takes Prozac for anxiety and depression.  Patient states she has spoken with her  provider regarding common side effects to the medication and the signs and symptoms she is experiencing may be attributed to Prozac.  Patient also advised to get an updated eye exam and incorporate foods high in protein every two hours and drink at least 6-8 bottles of water daily.   -Patient desires to take Micronor one PO daily #13.  Prescription sent to pharmacy. Patient advised to use condoms as a backup method for the next 2 weeks.  -PHQ-9= 9, patient has a history of anxiety and depression.  Currently taking medication and sees a therapist.  Encourage to continue medication regimen and services.   -See Nexplanon removal note.   - norethindrone (MICRONOR) 0.35 MG tablet; Take 1 tablet (0.35 mg total) by mouth daily.  Dispense: 28 tablet; Refill: 12  2. Well woman exam  with routine gynecological exam -Normal well woman exam. -Next CBE 12/24 -PAP due 12/24  3. Screening examination for venereal disease -STD screening today. -Patient accepted all screenings including oral GC, vaginal CT/GC and bloodwork for HIV/RPR.  Patient meets criteria for HepB screening? No. Ordered? No - low risk  Patient meets criteria for HepC screening? No. Ordered? No - low risk   Treat wet prep per standing order Discussed time line for [facility] results and that patient will be called with positive results and encouraged patient to call if she had not heard in 2 weeks.  Counseled to return or seek care for continued or worsening symptoms Recommended condom use with all sex  Patient is currently using *Nexplanon to prevent pregnancy.   - HIV [place] [facility] - Syphilis Serology, [facility] - Chlamydia/Gonorrhea [place] [facility] - Gonococcus culture - WET PREP FOR TRICH, YEAST, CLUE     Return in about 1 year (around 04/23/2023) for Annual well-woman exam.   Glenna FellowsAyo Nevelyn Mellott, FNP

## 2022-04-23 NOTE — Progress Notes (Signed)
Nexplanon Removal ?Patient identified, informed consent performed, consent signed.   Appropriate time out taken. Nexplanon site identified.  Area prepped in usual sterile fashon. 3 ml of 1% lidocaine with Epinephrine was used to anesthetize the area at the distal end of the implant and along implant site. A small stab incision was made right beside the implant on the distal portion.  The Nexplanon rod was grasped using hemostats/manual and removed without difficulty.  There was minimal blood loss. There were no complications.  Steri-strips were applied over the small incision.  A pressure bandage was applied to reduce any bruising.  The patient tolerated the procedure well and was given post procedure instructions.   Spero Gunnels, FNP  ?

## 2022-04-27 LAB — GONOCOCCUS CULTURE

## 2022-07-06 ENCOUNTER — Other Ambulatory Visit: Payer: Self-pay | Admitting: Internal Medicine

## 2022-07-06 DIAGNOSIS — R7989 Other specified abnormal findings of blood chemistry: Secondary | ICD-10-CM

## 2022-07-13 ENCOUNTER — Ambulatory Visit: Payer: Managed Care, Other (non HMO)

## 2022-12-06 ENCOUNTER — Ambulatory Visit: Payer: Self-pay | Admitting: Licensed Clinical Social Worker

## 2022-12-06 DIAGNOSIS — F331 Major depressive disorder, recurrent, moderate: Secondary | ICD-10-CM

## 2022-12-06 DIAGNOSIS — F411 Generalized anxiety disorder: Secondary | ICD-10-CM | POA: Insufficient documentation

## 2022-12-06 NOTE — Progress Notes (Signed)
Counselor/Therapist Progress Note  Patient ID: Barbara Jones, MRN: [id number],    Date: 12/06/2022  Time Spent: 46 minutes    Treatment Type: Psychotherapy  Reported Symptoms:  Anxiety, anxiousness, panic, low mood, low energy, low motivation, low appetite, depressed, sadness   Mental Status Exam:  Appearance:   Casual    Behavior:  Appropriate and Sharing  Motor:  Normal  Speech/Language:   Clear and Coherent and Normal Rate  Affect:  Congruent and Depressed  Mood:  dysthymic  Thought process:  normal  Thought content:    WNL  Sensory/Perceptual disturbances:    WNL  Orientation:  oriented to person, place, time/date, situation, and day of week  Attention:  Good  Concentration:  Good  Memory:  WNL  Fund of knowledge:   Good  Insight:    Fair  Judgment:   Fair  Impulse Control:  Fair   Risk Assessment: Danger to Self:  No Self-injurious Behavior: No Danger to Others: No Duty to Warn:no Physical Aggression / Violence:No  Access to Firearms a concern: No  Gang Involvement:No   Subjective: Patient was engaged and cooperative throughout the session using time effectively to discuss current symptoms, psychosocial stressors and goal of treatment. Patient voices anxiety and depressive symptoms and trauma triggers related to pregnancy, including panic symptoms. Patient is recommended for continued talk therapy and medication management eval.    Baby due 05/05/23 found out 09/25/2022  Interventions: Cognitive Behavioral Therapy and client centered  Established psychological safety.Checked in with patient. Set session agenda. Conducted brief assessment and reviewed confidentiality and emergency contact - [phone], and discussed goal of treatment and treatment plan.     Diagnosis:   ICD-10-CM   1. Major depressive disorder, recurrent episode, moderate (HCC)  F33.1     2. Generalized anxiety disorder  F41.1       Plan: Patient's goal is to feel a lot better with herself, to feel  comfortable, not pay too much attention to the feeling of the trauma, and feeling more comfortable with the pregnancy.   Treatment Target: Increase realistic balanced thinking -to learn how to replace thinking with thoughts that are more accurate or helpful Explore patient's thoughts, beliefs, automatic thoughts, assumptions  Identify and replace unhelpful thinking patterns (upsetting ideas, self-talk and mental images) Process distress and allow for emotional release  Questioning and challenging thoughts Cognitive reappraisal  Restructuring, Socratic questioning  Treatment Target: Increase emotional regulation  Coping skills to manage anxiety / panic  Mindfulness   Teach distress tolerance techniques - "what helps me"  Opposite action PLEASE  skills or self-care skills  Self-soothing  Cope ahead skills - imagery, rehearsal, problem-solving, exposure   Assertiveness communication   Future Appointments  Date Time Provider [place]  12/14/2022  1:00 PM Milton Ferguson, LCSW [facility] None    Milton Ferguson, LCSW

## 2022-12-14 ENCOUNTER — Ambulatory Visit: Payer: Self-pay | Admitting: Licensed Clinical Social Worker

## 2022-12-14 NOTE — Progress Notes (Unsigned)
Counselor/Therapist Progress Note  Patient ID: Barbara Jones, MRN: [id number],    Date: 12/14/2022  Time Spent: ***   Treatment Type: Individual Therapy  Reported Symptoms: {CHL AMB Reported [phone]}  Mental Status Exam:  Appearance:   {PSY:22683}     Behavior:  {PSY:21022743}  Motor:  {PSY:22302}  Speech/Language:   {PSY:22685}  Affect:  {PSY:22687}  Mood:  {PSY:31886}  Thought process:  {PSY:31888}  Thought content:    {PSY:[phone]}  Sensory/Perceptual disturbances:    {PSY:[phone]}  Orientation:  {PSY:30297}  Attention:  {PSY:22877}  Concentration:  {[phone]}  Memory:  {[phone]}  Fund of knowledge:   {[phone]}  Insight:    {[phone]}  Judgment:   {[phone]}  Impulse Control:  {[phone]}   Risk Assessment: Danger to Self:  No Self-injurious Behavior: No Danger to Others: No Duty to Warn:no Physical Aggression / Violence:No  Access to Firearms a concern: No  Gang Involvement:No   Subjective: Patient was engaged and cooperative throughout the session using time effectively to discuss    . Patient was receptive to feedback and intervention from LCSW. Patient voices continued motivation for treatment and understanding of  . Patient is likely to benefit from future treatment because they remain motivated to decrease  and   and reports benefit of regular sessions.      Interventions: {PSY:[id number]} Established psychological safety. Checked in with patient regarding her week. Reviewed previous session regarding  Therapist assisted, actively listened, taught, shared, role/played, provided Provided support through active listening, validation of feelings, and highlighted patient's strengths.    Diagnosis:   ICD-10-CM   1. Major depressive disorder, recurrent episode, moderate (HCC)  F33.1     2. Generalized anxiety disorder  F41.1      Plan: Patient's goal is to feel a lot better with herself, to feel comfortable,  not pay too much attention to the feeling of the trauma, and feeling more comfortable with the pregnancy.    Treatment Target: Increase realistic balanced thinking -to learn how to replace thinking with thoughts that are more accurate or helpful Explore patient's thoughts, beliefs, automatic thoughts, assumptions  Identify and replace unhelpful thinking patterns (upsetting ideas, self-talk and mental images) Process distress and allow for emotional release  Questioning and challenging thoughts Cognitive reappraisal  Restructuring, Socratic questioning  Treatment Target: Increase emotional regulation  Coping skills to manage anxiety / panic  Mindfulness   Teach distress tolerance techniques - "what helps me"  Opposite action PLEASE  skills or self-care skills  Self-soothing  Cope ahead skills - imagery, rehearsal, problem-solving, exposure   Assertiveness communication   Future Appointments  Date Time Provider [place]  12/14/2022  1:00 PM Milton Ferguson, LCSW [facility] None    Milton Ferguson, LCSW

## 2022-12-21 ENCOUNTER — Ambulatory Visit: Payer: Self-pay | Admitting: Licensed Clinical Social Worker

## 2022-12-21 DIAGNOSIS — F331 Major depressive disorder, recurrent, moderate: Secondary | ICD-10-CM

## 2022-12-21 DIAGNOSIS — F411 Generalized anxiety disorder: Secondary | ICD-10-CM

## 2022-12-21 NOTE — Progress Notes (Signed)
Counselor/Therapist Progress Note  Patient ID: Barbara Jones, MRN: [id number],    Date: 12/21/2022  Time Spent: 30 minutes    Treatment Type: Psychotherapy  Reported Symptoms:  mild anxiety, and worries    Mental Status Exam:  Appearance:   Casual and Neat     Behavior:  Appropriate and Sharing  Motor:  Normal  Speech/Language:   Clear and Coherent and Normal Rate  Affect:  Appropriate, Congruent, and Full Range  Mood:  normal  Thought process:  normal  Thought content:    WNL  Sensory/Perceptual disturbances:    WNL  Orientation:  oriented to person, place, time/date, and situation  Attention:  Fair  Concentration:  Good  Memory:  WNL  Fund of knowledge:   Fair  Insight:    Fair  Judgment:   Fair  Impulse Control:  Fair   Risk Assessment: Danger to Self:  No Self-injurious Behavior: No Danger to Others: No Duty to Warn:no Physical Aggression / Violence:No  Access to Firearms a concern: No  Gang Involvement:No   Subjective: Patient was engaged and cooperative throughout the session. Patient was receptive to feedback and intervention from LCSW. Patient voices progress towards her goal of treatment and voices continued motivation for treatment every two weeks.    Interventions: Cognitive Behavioral Therapy Checked in with patient regarding their week. Clinician met with patient to identify needs related to stressors and functioning, and assess and monitor for signs and symptoms of anxiety and depression , and assess safety. The clinician checked in with patient about previous session related to belly touching while pregnant. LCSW processed with the patient how they have been doing since the last follow-up session. LCSW assisted patient in processing their emotions related to financial stressors. LCSW reviewed resources with patient, including food banks, [facility] and 211. Provided support through active listening, validation of feelings, and highlighted patient's  strengths.   Diagnosis:   ICD-10-CM   1. Major depressive disorder, recurrent episode, moderate (HCC)  F33.1     2. Generalized anxiety disorder  F41.1      Plan: Patient's goal is to feel a lot better with herself, to feel comfortable, not pay too much attention to the feeling of the trauma, and feeling more comfortable with the pregnancy.    Treatment Target: Increase realistic balanced thinking -to learn how to replace thinking with thoughts that are more accurate or helpful Explore patient's thoughts, beliefs, automatic thoughts, assumptions  Identify and replace unhelpful thinking patterns (upsetting ideas, self-talk and mental images) Process distress and allow for emotional release  Questioning and challenging thoughts Cognitive reappraisal  Restructuring, Socratic questioning  Treatment Target: Increase emotional regulation  Coping skills to manage anxiety / panic  Mindfulness   Teach distress tolerance techniques - "what helps me"  Opposite action PLEASE  skills or self-care skills  Self-soothing  Cope ahead skills - imagery, rehearsal, problem-solving, exposure   Assertiveness communication   Future Appointments  Date Time Provider [place]  01/03/2023  2:00 PM Milton Ferguson, LCSW [facility] None   Milton Ferguson, LCSW

## 2023-01-03 ENCOUNTER — Ambulatory Visit: Payer: Self-pay | Admitting: Licensed Clinical Social Worker

## 2023-01-03 DIAGNOSIS — F331 Major depressive disorder, recurrent, moderate: Secondary | ICD-10-CM

## 2023-01-03 DIAGNOSIS — F411 Generalized anxiety disorder: Secondary | ICD-10-CM

## 2023-01-03 NOTE — Progress Notes (Signed)
Counselor/Therapist Progress Note  Patient ID: Barbara Jones, MRN: [id number],    Date: 01/03/2023  Time Spent: 40 minutes    Treatment Type: Psychotherapy  Reported Symptoms:  Depressed mood, anxiety, somatic symptoms related to past trauma   Mental Status Exam:  Appearance:   Casual and Guarded     Behavior:  Appropriate and cooperative and minimal engagement   Motor:  Normal  Speech/Language:   Clear and Coherent and Normal Rate  Affect:  Appropriate, Congruent, and Flat  Mood:  normal  Thought process:  normal  Thought content:    WNL  Sensory/Perceptual disturbances:    WNL  Orientation:  oriented to person, place, time/date, and situation  Attention:  Good  Concentration:  Good  Memory:  WNL  Fund of knowledge:   Good  Insight:    Fair  Judgment:   Fair  Impulse Control:  Fair   Risk Assessment: Danger to Self:  No Self-injurious Behavior: No Danger to Others: No Duty to Warn:no Physical Aggression / Violence:No  Access to Firearms a concern: No  Gang Involvement:No   Subjective: Patient was receptive to feedback and intervention from LCSW and participated throughout the session with minimal responses. Patient is likely to benefit from future treatment because she remains motivated to decrease trauma symptoms, depressed mood and anxiety and reports benefit of regular sessions in addressing these symptoms.     Interventions: Cognitive Behavioral Therapy and Mindfulness Meditation Established psychological safety. Checked in with patient regarding their week. The clinician processed with the patient how they have been doing since the last follow-up session. LCSW praised patient for use of grounding skills and intentional breathing. LCSW taught patient about observing, describing, and engaging in emotions vs pushing them away/avoidance, opposite emotion, use of ice pack for somatic symptoms on her shoulders and mood regulation, and provided education and a rational for CPT  and use of trauma narratives. Provided support through active listening, validation of feelings, and highlighted patient's strengths.   Diagnosis:   ICD-10-CM   1. Major depressive disorder, recurrent episode, moderate (HCC)  F33.1     2. Generalized anxiety disorder  F41.1      Plan: Patient's goal is to feel a lot better with herself, to feel comfortable, not pay too much attention to the feeling of the trauma, and feeling more comfortable with the pregnancy.    Treatment Target: Increase realistic balanced thinking -to learn how to replace thinking with thoughts that are more accurate or helpful Explore patient's thoughts, beliefs, automatic thoughts, assumptions  Identify and replace unhelpful thinking patterns (upsetting ideas, self-talk and mental images) Process distress and allow for emotional release  Questioning and challenging thoughts Cognitive reappraisal  Restructuring, Socratic questioning  Treatment Target: Increase emotional regulation  Coping skills to manage anxiety / panic  Mindfulness   Teach distress tolerance techniques - "what helps me"  Opposite action PLEASE  skills or self-care skills  Self-soothing  Cope ahead skills - imagery, rehearsal, problem-solving, exposure   Assertiveness communication   Future Appointments  Date Time Provider [place]  01/17/2023  2:00 PM Milton Ferguson, LCSW [facility] None     Milton Ferguson, [place]

## 2023-01-17 ENCOUNTER — Ambulatory Visit: Payer: Self-pay | Admitting: Licensed Clinical Social Worker

## 2023-01-17 DIAGNOSIS — F411 Generalized anxiety disorder: Secondary | ICD-10-CM

## 2023-01-17 DIAGNOSIS — F331 Major depressive disorder, recurrent, moderate: Secondary | ICD-10-CM

## 2023-01-17 NOTE — Progress Notes (Signed)
Counselor/Therapist Progress Note  Patient ID: Crickett Stocks[place], MRN: [id number],    Date: 01/17/2023  Time Spent: 35 minutes    Treatment Type: Psychotherapy  Reported Symptoms:  overall mood improvement- doing "good"   Mental Status Exam:  Appearance:   Casual     Behavior:  Appropriate, Sharing, and Motivated  Motor:  Normal  Speech/Language:   Clear and Coherent and Normal Rate  Affect:  Appropriate, Congruent, and Full Range  Mood:  normal  Thought process:  normal  Thought content:    WNL  Sensory/Perceptual disturbances:    WNL  Orientation:  oriented to person, place, time/date, and situation  Attention:  Good  Concentration:  Good  Memory:  WNL  Fund of knowledge:   Good  Insight:    Fair  Judgment:   Fair  Impulse Control:  Fair   Risk Assessment: Danger to Self:  No Self-injurious Behavior: No Danger to Others: No Duty to Warn:no Physical Aggression / Violence:No  Access to Firearms a concern: No  Gang Involvement:No   Subjective: Patient was engaged and cooperative throughout the session using time to discuss use of coping skills. She reports benefit from homework exercise of using cold, ice and or pressure/massage on somatic sensations. Patient voices continued motivation for treatment and is likely to benefit as she reports benefit from regular sessions addressing trauma symptoms and overall improvement in mood and decrease in distress.   Interventions: Cognitive Behavioral Therapy and client centered  Established psychological safety. Checked in with patient regarding her week. Reviewed previous session regarding strategies to manage somatic sensations. LCSW reviewed journaling or writing out recurring thoughts related to childhood trauma/writing trauma narrative. Discussed challenges with showering being activating at times, encouraged patient to use 5 senses with specific focus on temperature and smells. Encouraged patient to continue getting outside for walks.   Provided support through active listening, validation of feelings, and highlighted patient's strengths.   Diagnosis:   ICD-10-CM   1. Major depressive disorder, recurrent episode, moderate (HCC)  F33.1     2. Generalized anxiety disorder  F41.1      Plan: Patient's goal is to feel a lot better with herself, to feel comfortable, not pay too much attention to the feeling of the trauma, and feeling more comfortable with the pregnancy.    Treatment Target: Increase realistic balanced thinking -to learn how to replace thinking with thoughts that are more accurate or helpful Explore patient's thoughts, beliefs, automatic thoughts, assumptions  Identify and replace unhelpful thinking patterns (upsetting ideas, self-talk and mental images) Process distress and allow for emotional release  Questioning and challenging thoughts Cognitive reappraisal  Restructuring, Socratic questioning  Treatment Target: Increase emotional regulation  Coping skills to manage anxiety / panic  Mindfulness   Teach distress tolerance techniques - "what helps me"  Opposite action PLEASE  skills or self-care skills  Self-soothing  Cope ahead skills - imagery, rehearsal, problem-solving, exposure   Assertiveness communication   Future Appointments  Date Time Provider [place]  01/31/2023  2:00 PM Milton Ferguson, LCSW [facility] None      Milton Ferguson, LCSW

## 2023-01-31 ENCOUNTER — Ambulatory Visit: Payer: Self-pay | Admitting: Licensed Clinical Social Worker

## 2023-01-31 DIAGNOSIS — F411 Generalized anxiety disorder: Secondary | ICD-10-CM

## 2023-01-31 DIAGNOSIS — F331 Major depressive disorder, recurrent, moderate: Secondary | ICD-10-CM

## 2023-01-31 NOTE — Progress Notes (Signed)
Counselor/Therapist Progress Note  Patient ID: Barbara Jones, MRN: [id number],    Date: 01/31/2023  Time Spent: 25 minutes    Treatment Type: Psychotherapy  Reported Symptoms:  "been good, less thoughts, doing better"  Mental Status Exam:  Appearance:   Casual and Neat     Behavior:  Appropriate, Sharing, and responsive   Motor:  Normal  Speech/Language:   Clear and Coherent and Normal Rate  Affect:  Appropriate, Congruent, and Full Range  Mood:  normal  Thought process:  normal  Thought content:    WNL  Sensory/Perceptual disturbances:    WNL  Orientation:  oriented to person, place, time/date, situation, and day of week  Attention:  Good  Concentration:  Good  Memory:  WNL  Fund of knowledge:   Good  Insight:    Fair  Judgment:   Fair  Impulse Control:  Fair   Risk Assessment: Danger to Self:  No Self-injurious Behavior: No Danger to Others: No Duty to Warn:no Physical Aggression / Violence:No  Access to Firearms a concern: No  Gang Involvement:No   Subjective: Patient was minimally engaged, was responsive and cooperative throughout the session using time to discuss progress towards goal of treatment. Patient reports improvements in anxious thoughts and overall decrease in activating trauma responses. Patient reports that she feels like sessions can be decreased and agreed to next session being schedule out for one month.    Interventions: Cognitive Behavioral Therapy and client centered  Established psychological safety. Checked in with patient regarding their week and current anxiety symptoms. LCSW reviewed with patient her experience of decrease in anxiety and activating trauma responses; reviewed progress towards goal of treatment and discussed stepping down on frequency of sessions. Provided support through active listening, validation of feelings, and highlighted patient's strengths.   Diagnosis:   ICD-10-CM   1. Major depressive disorder, recurrent episode, moderate  (HCC)  F33.1     2. Generalized anxiety disorder  F41.1      Plan: Patient's goal is to feel a lot better with herself, to feel comfortable, not pay too much attention to the feeling of the trauma, and feeling more comfortable with the pregnancy.    Treatment Target: Increase realistic balanced thinking -to learn how to replace thinking with thoughts that are more accurate or helpful Explore patient's thoughts, beliefs, automatic thoughts, assumptions  Identify and replace unhelpful thinking patterns (upsetting ideas, self-talk and mental images) Process distress and allow for emotional release  Questioning and challenging thoughts Cognitive reappraisal  Restructuring, Socratic questioning  Treatment Target: Increase emotional regulation  Coping skills to manage anxiety / panic  Mindfulness   Teach distress tolerance techniques - "what helps me"  Opposite action PLEASE  skills or self-care skills  Self-soothing  Cope ahead skills - imagery, rehearsal, problem-solving, exposure   Assertiveness communication   Future Appointments  Date Time Provider [place]  02/28/2023  2:00 PM Milton Ferguson, LCSW [facility] None    Milton Ferguson, LCSW

## 2023-02-20 ENCOUNTER — Ambulatory Visit: Admit: 2023-02-20 | Payer: Managed Care, Other (non HMO)

## 2023-02-28 ENCOUNTER — Ambulatory Visit: Payer: Self-pay | Admitting: Licensed Clinical Social Worker

## 2023-02-28 DIAGNOSIS — F411 Generalized anxiety disorder: Secondary | ICD-10-CM

## 2023-02-28 DIAGNOSIS — F331 Major depressive disorder, recurrent, moderate: Secondary | ICD-10-CM

## 2023-02-28 NOTE — Progress Notes (Signed)
Counselor/Therapist Progress Note  Patient ID: Barbara Jones, MRN: [id number],    Date: 02/28/2023  Time Spent: 23 minutes    Treatment Type: Psychotherapy  Reported Symptoms:  "mood swings", irritability,overwhelm    Mental Status Exam:  Appearance:   Casual     Behavior:  Appropriate, Sharing, and minimal participation, responsive   Motor:  Normal  Speech/Language:   Clear and Coherent and Normal Rate  Affect:  Appropriate, Congruent, and Full Range  Mood:  normal  Thought process:  normal  Thought content:    WNL  Sensory/Perceptual disturbances:    WNL  Orientation:  oriented to person, place, time/date, situation, and day of week  Attention:  Good  Concentration:  Good  Memory:  WNL  Fund of knowledge:   Good  Insight:    Fair  Judgment:   Fair  Impulse Control:  Fair   Risk Assessment: Danger to Self:  No Self-injurious Behavior: No Danger to Others: No Duty to Warn:no Physical Aggression / Violence:No  Access to Firearms a concern: No  Gang Involvement:No   Subjective: Patient was cooperative throughout the session using time to discuss thoughts and feelings. Patient reports that she has been having "mood swings" and describes these as irritability about things she does not think should, and has not in the past. She reports this increased irritability at both home and at work. Patient reports that she started Prozac a few days ago and thinks that it's helping, she denies any side effects. Patient voices continued motivation for treatment and wants to meet in one month.   Interventions: Cognitive Behavioral Therapy and Client Centered  Established psychological safety. Checked in with patient regarding symptoms. LCSW explored patient's symptoms of increased irritability and discussed medication management. LCSW highlighted unhelpful thought patterns leading to increased irritability and reframed these thoughts and challenged these thoughts, and encouraged patient to notice  her thoughts. Discussed continued treatment plan to meet again in one month. Provided support through active listening, validation of feelings, and highlighted patient's strengths.  Diagnosis:   ICD-10-CM   1. Major depressive disorder, recurrent episode, moderate  F33.1     2. Generalized anxiety disorder  F41.1       Plan: Patient's goal is to feel a lot better with herself, to feel comfortable, not pay too much attention to the feeling of the trauma, and feeling more comfortable with the pregnancy.    Treatment Target: Increase realistic balanced thinking -to learn how to replace thinking with thoughts that are more accurate or helpful Explore patient's thoughts, beliefs, automatic thoughts, assumptions  Identify and replace unhelpful thinking patterns (upsetting ideas, self-talk and mental images) Process distress and allow for emotional release  Questioning and challenging thoughts Cognitive reappraisal  Restructuring, Socratic questioning  Treatment Target: Increase emotional regulation  Coping skills to manage anxiety / panic  Mindfulness   Teach distress tolerance techniques - "what helps me"  Opposite action PLEASE  skills or self-care skills  Self-soothing  Cope ahead skills - imagery, rehearsal, problem-solving, exposure   Assertiveness communication   Future Appointments  Date Time Provider [place]  03/28/2023  2:00 PM Milton Ferguson, LCSW [facility] None     Milton Ferguson, [place]

## 2023-03-28 ENCOUNTER — Ambulatory Visit: Payer: Self-pay | Admitting: Licensed Clinical Social Worker

## 2023-03-28 DIAGNOSIS — F331 Major depressive disorder, recurrent, moderate: Secondary | ICD-10-CM

## 2023-03-28 DIAGNOSIS — F411 Generalized anxiety disorder: Secondary | ICD-10-CM

## 2023-03-28 NOTE — Progress Notes (Signed)
Counselor/Therapist Progress Note  Patient ID: Barbara Jones, MRN: [id number],    Date: 03/28/2023  Time Spent: 35 minutes    Treatment Type: Psychotherapy  Reported Symptoms:  Overall mood has been stable; increased anxiety since last week   Mental Status Exam:  Appearance:   Casual     Behavior:  Appropriate, Sharing, and minimal responses   Motor:  Normal  Speech/Language:   Clear and Coherent and Normal Rate  Affect:  Appropriate, Congruent, and Full Range  Mood:  normal  Thought process:  normal  Thought content:    WNL  Sensory/Perceptual disturbances:    WNL  Orientation:  oriented to person, place, time/date, situation, and day of week  Attention:  Good  Concentration:  Good  Memory:  WNL  Fund of knowledge:   Good  Insight:    Fair  Judgment:   Fair  Impulse Control:  Fair   Risk Assessment: Danger to Self:  No Self-injurious Behavior: No Danger to Others: No Duty to Warn:no Physical Aggression / Violence:No  Access to Firearms a concern: No  Gang Involvement:No   Subjective: Patient was engaged and cooperative throughout the session using time to discuss thoughts, feelings and coping skills  Patient was receptive to feedback and intervention from LCSW and reports benefit from this session. Patient voices continued motivation for treatment and requests appt near her delivery date.      Interventions: Cognitive Behavioral Therapy and Client Centered  Clinician met with patient to identify needs related to stressors and functioning, and assess and monitor for signs and symptoms of depression and anxiety, and assess safety. The clinician processed with the patient how they have been doing since the last follow-up session. LCSW explored patient's experience of anxiety related to upcoming delivery, validated and normalized anxiety, taught patient about the felt physical sensations of both anxiety and excitement, taught patient to focus on what she does know and what she  does have control over and assisted patient in identifying both of these as well as how this related to her delivery plans. Provided support through active listening, validation of feelings, and highlighted patient's strengths.    Diagnosis:   ICD-10-CM   1. Major depressive disorder, recurrent episode, moderate (HCC)  F33.1     2. Generalized anxiety disorder  F41.1       Plan: Patient's goal is to feel a lot better with herself, to feel comfortable, not pay too much attention to the feeling of the trauma, and feeling more comfortable with the pregnancy.    Treatment Target: Increase realistic balanced thinking -to learn how to replace thinking with thoughts that are more accurate or helpful Explore patient's thoughts, beliefs, automatic thoughts, assumptions  Identify and replace unhelpful thinking patterns (upsetting ideas, self-talk and mental images) Process distress and allow for emotional release  Questioning and challenging thoughts Cognitive reappraisal  Restructuring, Socratic questioning  Treatment Target: Increase emotional regulation  Coping skills to manage anxiety / panic  Mindfulness   Teach distress tolerance techniques - "what helps me"  Opposite action PLEASE  skills or self-care skills  Self-soothing  Cope ahead skills - imagery, rehearsal, problem-solving, exposure   Assertiveness communication   Future Appointments  Date Time Provider Department Center  04/25/2023  2:00 PM Kathreen Cosier, LCSW [facility] None      Kathreen Cosier, LCSW

## 2023-04-25 ENCOUNTER — Ambulatory Visit: Payer: Self-pay | Admitting: Licensed Clinical Social Worker

## 2023-05-02 ENCOUNTER — Ambulatory Visit: Payer: Self-pay | Admitting: Licensed Clinical Social Worker

## 2023-05-02 DIAGNOSIS — F411 Generalized anxiety disorder: Secondary | ICD-10-CM

## 2023-05-02 DIAGNOSIS — F331 Major depressive disorder, recurrent, moderate: Secondary | ICD-10-CM

## 2023-05-02 NOTE — Progress Notes (Signed)
TELEHEALTH VIRTUAL MOOD VISIT ENCOUNTER NOTE  I connected with Barbara Jones on 05/02/23 by telephone and verified that I am speaking with the correct person using two identifiers.   I discussed the limitations, risks, security and privacy concerns of performing an evaluation and management service by telephone and the availability of in person appointments. I also discussed with the patient that there may be a patient responsible charge related to this service. The patient expressed understanding and agreed to proceed.  LCSW introduced self and established psychological safety.  Patient verbally consented to this telephonic session.   Patient is located at home. LCSW located at remote work location   Time visit started: 2:02pm  Time visit ended: 2:45pm   SUBJECTIVE: Patient clinic or provider recommended a 2 week mood check due to risk factors for postpartum mood disorder. Patient delivered on 05/28/202024. Patient reports that she feels "good". She states she is "just trying to adjust to motherhood". She reports that the birth was overwhelming, but she had lots of support from family and two doula's, which she reports benefit from and denies having a traumatic birth experience. Patient reports some concerns about feeling some sadness and low appetite since she gave birth one week ago. She reports connecting well with baby and even breastfeed for the 1st time today, which she reports was relieving and felt good. Patient reports that the hardest thing has been trying to get baby to sleep and she feels sad when she cannot consol baby. Patient also reports having lots of support which allows her to get some needed sleep, but she report that she's still getting limited sleep.   ASSESSMENT: Patient currently experiencing possible "baby blues". Her symptoms include intermittent sadness, low mood, overwhelm, mild anxiousness, and low appetite. EPDS = 7 with overall predominant mood being good. Patient  reports bounding well with baby, having a good support system, feeling emotional when she can not sooth baby, and feeling at times that she isn't sure she is dong everything right, she reports feeling good that baby breastfeed/latched for the first time today. She reports taking medication as prescribed and denies any intrusive thoughts, or odd or scary thoughts, or any thoughts of self-harm or harm to others. Patient reports that she has gotten out of the house a few times since having baby and reports benefit from that and was receptive to getting outside daily. Patient was receptive to trying to eat something small with protein every few hours and has agreed to follow up appointment in two weeks.       05/02/2023    2:13 PM 12/06/2022    1:16 PM  Edinburgh Postnatal Depression Scale Screening Tool  I have been able to laugh and see the funny side of things. 0 0  I have looked forward with enjoyment to things. 1 3  I have blamed myself unnecessarily when things went wrong. 0 2  I have been anxious or worried for no good reason. 0 2  I have felt scared or panicky for no good reason. 2 0  Things have been getting on top of me. 1 2  I have been so unhappy that I have had difficulty sleeping. 0 0  I have felt sad or miserable. 2 1  I have been so unhappy that I have been crying. 1 1  The thought of harming myself has occurred to me. 0 0  Edinburgh Postnatal Depression Scale Total 7 11     Patient may benefit from continued therapy  and medication management.  PRESENTING CONCERNS: Patient and/or family reports the following symptoms/concerns: patent reports symptoms of baby blues, including times of low mood, sadness at times, mild anxiety symptoms, and low appetite. EDPS = 7 Duration of problem: 7 days; Severity of problem: mild  STRENGTHS (Protective Factors/Coping Skills): Patient reports having good support from father of the baby and the paternal grandmother of the baby. She reports she is  attaching / bounding with baby well. She also reports taking medication daily (prescribed Prozac).   INTERVENTIONS: Interventions utilized:  Psychoeducation and/or Health Education Standardized Assessments completed: Edinburgh Postnatal Depression = 7.   Conducted brief assessment  Provide brief psychoeducation on postpartum mood and anxiety disorders signs and symptoms, including information on Baby Blues, Postpartum Depression, and Postpartum Anxiety.   Discussed self-care strategies to prevent and/or reduce symptoms of postpartum mood and anxiety disorders, including sleep hygiene, eating regularly, drinking fluids, getting outside, and support from partner, family, and/or friends.   Informed patient to call her provider right away or get emergency help if she experiences any of the following symptoms: Feelings of hopelessness and total despair. Feeling out of touch with reality (hearing or seeing things other people don't). Feeling that you might hurt yourself or your baby.  PLAN: continue talk therapy and medication management.   Future Appointments  Date Time Provider Department Center  05/15/2023 11:00 AM Kathreen Cosier, LCSW [facility] None   Kathreen Cosier

## 2023-05-15 ENCOUNTER — Ambulatory Visit: Payer: Self-pay | Admitting: Licensed Clinical Social Worker

## 2023-05-15 DIAGNOSIS — F331 Major depressive disorder, recurrent, moderate: Secondary | ICD-10-CM

## 2023-05-15 DIAGNOSIS — F411 Generalized anxiety disorder: Secondary | ICD-10-CM

## 2023-05-15 NOTE — Progress Notes (Signed)
Counselor/Therapist Progress Note  Patient ID: Barbara Jones, MRN: [id number],    Date: 05/15/2023  Time Spent: 29 minutes    Treatment Type: Psychotherapy  Reported Symptoms:  mood "good", appetite improving, looking forward to the next day   Mental Status Exam:  Appearance:   NA     Behavior:  Appropriate and minimal responses  Motor:  NA  Speech/Language:   Clear and Coherent and Normal Rate  Affect:  NA  Mood:  normal  Thought process:  normal  Thought content:    WNL  Sensory/Perceptual disturbances:    WNL  Orientation:  oriented to person, place, time/date, situation, and day of week  Attention:  Good  Concentration:  Good  Memory:  WNL  Fund of knowledge:   Good  Insight:    Fair  Judgment:   Fair  Impulse Control:  Fair   Risk Assessment: Danger to Self:  No Self-injurious Behavior: No Danger to Others: No Duty to Warn:no Physical Aggression / Violence:No  Access to Firearms a concern: No  Gang Involvement:No   Subjective: Patient was receptive to feedback and intervention from LCSW and participated throughout the session- she was responsive with minimal responses. Patient reports that she is connecting well with baby and her mood is good and looking forward to watching the baby grow. She also reports that she is learning his cues. Patient does report some overwhelm when she ws not able to sooth him but her boyfriend helped and they were able to figure out that baby did not like the nipple on the bottle. Patient reports that she is interested in continuing sessions.   Interventions: Cognitive Behavioral Therapy and client centered  Established psychological safety. LCSW met with patient to identify needs related to stressors and functioning, and assess and monitor for signs and symptoms of depression and anxiety , and assess safety. Checked in with patient regarding their week and processed with the patient how they have been doing since the last follow-up session.  LCSW validated patient's feelings of overwhelm related to difficulties in always knowing all baby's needs. LCSW provided psychoeducation on this experience to normalize patient's feelings and perceptions and encouraged her to not judge herself. Encouraged patient to continue to lean in for support of her partner and to always place baby in safe place and walk away if needed; provided information about [facility] Crying and encouraged her to read about it.  Provided support through active listening, validation of feelings, and highlighted patient's strengths.  Diagnosis:   ICD-10-CM   1. Major depressive disorder, recurrent episode, moderate (HCC)  F33.1     2. Generalized anxiety disorder  F41.1       Plan: Patient's goal is to feel a lot better with herself, to feel comfortable, not pay too much attention to the feeling of the trauma, and feeling more comfortable with the pregnancy.    Treatment Target: Increase realistic balanced thinking -to learn how to replace thinking with thoughts that are more accurate or helpful Explore patient's thoughts, beliefs, automatic thoughts, assumptions  Identify and replace unhelpful thinking patterns (upsetting ideas, self-talk and mental images) Process distress and allow for emotional release  Questioning and challenging thoughts Cognitive reappraisal  Restructuring, Socratic questioning  Treatment Target: Increase emotional regulation  Coping skills to manage anxiety / panic  Mindfulness   Teach distress tolerance techniques - "what helps me"  Opposite action PLEASE  skills or self-care skills  Self-soothing  Cope ahead skills - imagery, rehearsal, problem-solving, exposure  Assertiveness communication   Future Appointments  Date Time Provider Department Center  05/29/2023 11:00 AM Kathreen Cosier, LCSW [facility] None    Kathreen Cosier, [place]

## 2023-05-29 ENCOUNTER — Ambulatory Visit: Payer: Self-pay | Admitting: Licensed Clinical Social Worker

## 2023-05-29 DIAGNOSIS — F411 Generalized anxiety disorder: Secondary | ICD-10-CM

## 2023-05-29 DIAGNOSIS — F331 Major depressive disorder, recurrent, moderate: Secondary | ICD-10-CM

## 2023-05-29 NOTE — Progress Notes (Signed)
Counselor/Therapist Progress Note  Patient ID: Barbara Jones, MRN: [id number],    Date: 05/29/2023  Time Spent: 27 minutes   Treatment Type: Individual Therapy  Reported Symptoms:  intermittent depressed mood when having a migraine, low appetite; anxiousness, worry    Mental Status Exam:  Appearance:   NA     Behavior:  Appropriate and minimal responses   Motor:  NA  Speech/Language:   Normal Rate  Affect:  NA  Mood:  normal  Thought process:  normal  Thought content:    WNL  Sensory/Perceptual disturbances:    WNL  Orientation:  oriented to person, place, time/date, situation, and day of week  Attention:  Fair  Concentration:  Good  Memory:  WNL  Fund of knowledge:   Good  Insight:    Fair  Judgment:   Fair  Impulse Control:  Fair   Risk Assessment: Danger to Self:  No Self-injurious Behavior: No Danger to Others: No Duty to Warn:no Physical Aggression / Violence:No  Access to Firearms a concern: No   Subjective: Patient was minimally engaged throughout the session. Patient shares that overall things have been good, she reports continued improvement in appetite, and does voice concerns about migraine's which she has been seen by her provider for. She shares when she has a migraine she does feel like her mood is off or depressed and it makes things difficult for her. When asked how she would rate how things have been going for her and also with adjusting to motherhood she rates it at a 1 out of 10 on how well things are going with 10 being terrible. She also expresses some difficulties when baby is crying and unable to "help" them, sharing that it makes her anxious and also states this has lessened over time and her confidence is improving. PatiShe continues to report having a lot of good support .Patient agreed to schedule another appointment for 2 weeks out.   Interventions: Cognitive Behavioral Therapy, Psycho-education/Bibliotherapy, and client centered  Established  psychological safety. LCSW met with patient to identify needs related to stressors and functioning, and assess and monitor for signs and symptoms of depression and anxiety, and assess safety. Checked in with patient regarding their week and processed with the patient how they have been doing since the last follow-up session. LCSW intervened with positive regard to validate patient's emotions, and supported client in reviewing and encouraging her to continue to challenge her thoughts, reframe her thoughts, practice self-compassion and exploring ways to increase her intentional use of self care   Diagnosis:   ICD-10-CM   1. Major depressive disorder, recurrent episode, moderate (HCC)  F33.1     2. Generalized anxiety disorder  F41.1      Plan: Patient's goal is to feel a lot better with herself, to feel comfortable, not pay too much attention to the feeling of the trauma, and feeling more comfortable with the pregnancy.    Treatment Target: Increase realistic balanced thinking -to learn how to replace thinking with thoughts that are more accurate or helpful Explore patient's thoughts, beliefs, automatic thoughts, assumptions  Identify and replace unhelpful thinking patterns (upsetting ideas, self-talk and mental images) Process distress and allow for emotional release  Questioning and challenging thoughts Cognitive reappraisal  Restructuring, Socratic questioning  Treatment Target: Increase emotional regulation  Coping skills to manage anxiety / panic  Mindfulness   Teach distress tolerance techniques - "what helps me"  Opposite action PLEASE  skills or self-care skills  Self-soothing  Cope  ahead skills - imagery, rehearsal, problem-solving, exposure   Assertiveness communication   Future Appointments  Date Time Provider Department Center  06/12/2023 11:00 AM Kathreen Cosier, LCSW [facility] None     Kathreen Cosier, [place]

## 2023-06-12 ENCOUNTER — Ambulatory Visit: Payer: Self-pay | Admitting: Licensed Clinical Social Worker

## 2023-06-26 ENCOUNTER — Ambulatory Visit: Payer: Self-pay | Admitting: Licensed Clinical Social Worker

## 2023-06-26 DIAGNOSIS — F331 Major depressive disorder, recurrent, moderate: Secondary | ICD-10-CM

## 2023-06-26 DIAGNOSIS — F411 Generalized anxiety disorder: Secondary | ICD-10-CM

## 2023-06-26 NOTE — Progress Notes (Signed)
Counselor/Therapist Progress Note  Patient ID: Barbara Jones, MRN: [id number],    Date: 06/26/2023  Time Spent: 12 minutes    Treatment Type: Individual Therapy  Reported Symptoms:  improvement in anxiety and mood symptoms   Mental Status Exam:  Appearance:   NA     Behavior:  Appropriate responsive   Motor:  NA  Speech/Language:   Normal Rate  Affect:  NA  Mood:  normal  Thought process:  normal  Thought content:    WNL  Sensory/Perceptual disturbances:    WNL  Orientation:  oriented to person, place, time/date, situation, and day of week  Attention:  Good  Concentration:  Good  Memory:  WNL  Fund of knowledge:   Good  Insight:    Fair  Judgment:   Fair  Impulse Control:  Fair   Risk Assessment: Danger to Self:  No Self-injurious Behavior: No Danger to Others: No Duty to Warn:no Physical Aggression / Violence:No  Access to Firearms a concern: No  Gang Involvement:No   Subjective: Patient was engaged and cooperative using time to discuss current state, progress made, and termination of services. Patient reports that everything is going really well. She reports that her anxiety has been low and her mood has been fine. She reports that she has increased confidence as a mom, speaking up for what she wants, and feels more adjusted to this new role as a mom. Patient in agreement with closing out services at this time and will reach out in the future as needed.    Interventions: Cognitive Behavioral Therapy and client centered  Established psychological safety. Clinician met with patient to identify needs related to stressors and functioning, and assess and monitor for signs and symptoms of anxiety and depression, and assess safety. The clinician checked in with the patient regarding how they have been doing since the last follow-up session. Briefly assessed symptoms and reviewed progress with patient. LCSW engaged patient in discussing termination of services, reviewed goal of  treatment and treatment plan, reviewed the importance of self-care and discussed returning for future services, as needed. LCSW encouraged patient to continue medication management.    Diagnosis:   ICD-10-CM   1. Major depressive disorder, recurrent episode, moderate (HCC)  F33.1     2. Generalized anxiety disorder  F41.1       Plan: Patient to seek services as needed and continue medication management.    No future appointments.    Kathreen Cosier, LCSW

## 2024-07-08 ENCOUNTER — Other Ambulatory Visit: Payer: Self-pay | Admitting: Medical Genetics

## 2024-07-13 ENCOUNTER — Other Ambulatory Visit: Payer: Self-pay

## 2024-07-13 ENCOUNTER — Other Ambulatory Visit
Admission: RE | Admit: 2024-07-13 | Discharge: 2024-07-13 | Disposition: A | Discharge status: Home / Self Care | Payer: Self-pay | Source: Ambulatory Visit | Attending: Medical Genetics | Admitting: Medical Genetics

## 2024-07-23 LAB — GENECONNECT MOLECULAR SCREEN: Genetic Analysis Overall Interpretation: NEGATIVE
